# Patient Record
Sex: Male | Born: 1990 | Race: Black or African American | Marital: Single | Smoking: Never smoker
Health system: Southern US, Community
[De-identification: ages and names within clinical notes are randomized; demographics above are authoritative.]

## PROBLEM LIST (undated history)

## (undated) DIAGNOSIS — N289 Disorder of kidney and ureter, unspecified: Secondary | ICD-10-CM

## (undated) HISTORY — PX: OTHER SURGICAL HISTORY: SHX169

## (undated) HISTORY — PX: ARTHROSCOPIC REPAIR ACL: SUR80

---

## 2013-11-12 ENCOUNTER — Other Ambulatory Visit: Payer: Self-pay | Admitting: Orthopedic Surgery

## 2013-11-12 DIAGNOSIS — M25561 Pain in right knee: Secondary | ICD-10-CM

## 2013-11-12 DIAGNOSIS — M25461 Effusion, right knee: Secondary | ICD-10-CM

## 2013-11-20 ENCOUNTER — Ambulatory Visit
Admission: RE | Admit: 2013-11-20 | Discharge: 2013-11-20 | Disposition: A | Discharge status: Home / Self Care | Payer: BC Managed Care – PPO | Source: Ambulatory Visit | Attending: Orthopedic Surgery | Admitting: Orthopedic Surgery

## 2013-11-20 DIAGNOSIS — M25461 Effusion, right knee: Secondary | ICD-10-CM

## 2013-11-20 DIAGNOSIS — M25561 Pain in right knee: Secondary | ICD-10-CM

## 2018-10-30 ENCOUNTER — Emergency Department (HOSPITAL_COMMUNITY): Payer: Self-pay

## 2018-10-30 ENCOUNTER — Other Ambulatory Visit: Payer: Self-pay

## 2018-10-30 ENCOUNTER — Emergency Department (HOSPITAL_COMMUNITY)
Admission: EM | Admit: 2018-10-30 | Discharge: 2018-10-30 | Disposition: A | Discharge status: Home / Self Care | Payer: Self-pay | Attending: Emergency Medicine | Admitting: Emergency Medicine

## 2018-10-30 ENCOUNTER — Encounter (HOSPITAL_COMMUNITY): Payer: Self-pay | Admitting: Emergency Medicine

## 2018-10-30 DIAGNOSIS — Y929 Unspecified place or not applicable: Secondary | ICD-10-CM | POA: Insufficient documentation

## 2018-10-30 DIAGNOSIS — S41112A Laceration without foreign body of left upper arm, initial encounter: Secondary | ICD-10-CM | POA: Insufficient documentation

## 2018-10-30 DIAGNOSIS — Y999 Unspecified external cause status: Secondary | ICD-10-CM | POA: Insufficient documentation

## 2018-10-30 DIAGNOSIS — W268XXA Contact with other sharp object(s), not elsewhere classified, initial encounter: Secondary | ICD-10-CM | POA: Insufficient documentation

## 2018-10-30 DIAGNOSIS — Y9367 Activity, basketball: Secondary | ICD-10-CM | POA: Insufficient documentation

## 2018-10-30 DIAGNOSIS — S51812A Laceration without foreign body of left forearm, initial encounter: Secondary | ICD-10-CM

## 2018-10-30 MED ORDER — LIDOCAINE-EPINEPHRINE (PF) 2 %-1:200000 IJ SOLN
5.0000 mL | Freq: Once | INTRAMUSCULAR | Status: AC
  Administered 2018-10-30: 5 mL
  Filled 2018-10-30: qty 10

## 2018-10-30 NOTE — Discharge Instructions (Signed)
Please read instructions below.  Keep your wound clean and covered. In 24 hours, you can get your wound wet; gently clean it with soap and water, pat it dry, and reapply a clean bandage. You can take ibuprofen/advil as needed for pain Follow up with your primary care or urgent care for wound recheck in 7 days.  Return to the ER for fever, pus draining from wound, redness, or new or worsening symptoms.  

## 2018-10-30 NOTE — ED Triage Notes (Signed)
Patient reports lac to left arm after cutting it on a fence. Unknown last tetanus.

## 2018-10-30 NOTE — ED Provider Notes (Signed)
[place] DEPT Provider Note   CSN: [id number] Arrival date & time: 10/30/18  1728    History   Chief Complaint Chief Complaint  Patient presents with  . Laceration    HPI Ian Hancock is a 28 y.o. male without significant PMHx, presenting to the ED with complaint sudden onset of laceration to left forearm that occurred while playing basketball. He states he accidentally got pushed into a chain link fence which caused laceration. No interventions prior to arrival. Denies numbness. Last tetanus was under 5 years ago. No hx of immunocompromise.     The history is provided by the patient.    History reviewed. No pertinent past medical history.  There are no active problems to display for this patient.   History reviewed. No pertinent surgical history.      Home Medications    Prior to Admission medications   Not on File    Family History No family history on file.  Social History Social History   Tobacco Use  . Smoking status: Not on file  Substance Use Topics  . Alcohol use: Not on file  . Drug use: Not on file     Allergies   Patient has no known allergies.   Review of Systems Review of Systems  Skin: Positive for wound.  Allergic/Immunologic: Negative for immunocompromised state.  Neurological: Negative for numbness.     Physical Exam Updated Vital Signs BP (!) 141/84   Pulse 65   Temp 99 F (37.2 C) (Oral)   Resp 20   SpO2 99%   Physical Exam Vitals signs and nursing note reviewed.  Constitutional:      General: He is not in acute distress.    Appearance: He is well-developed.  HENT:     Head: Normocephalic and atraumatic.  Eyes:     Conjunctiva/sclera: Conjunctivae normal.  Cardiovascular:     Rate and Rhythm: Normal rate.  Pulmonary:     Effort: Pulmonary effort is normal.  Musculoskeletal:     Comments: Left distal posterior lateral forearm with 2 cm linear laceration through the dermis.   Base of wound visualized without any obvious injury to tendon or muscle.  There is no obvious retained foreign body.  Wound is not actively bleeding.  Patient has normal strength with grip as well as in extension of the wrist.  Brisk capillary refill and normal distal sensation.  Neurological:     Mental Status: He is alert.  Psychiatric:        Mood and Affect: Mood normal.        Behavior: Behavior normal.      ED Treatments / Results  Labs (all labs ordered are listed, but only abnormal results are displayed) Labs Reviewed - No data to display  EKG None  Radiology Dg Forearm Left  Result Date: 10/30/2018 CLINICAL DATA:  Laceration of the forearm secondary to a fall into a chain link fence. EXAM: LEFT FOREARM - 2 VIEW COMPARISON:  None. FINDINGS: There is no evidence of fracture or other focal bone lesions. No radiodense foreign body in the soft tissues. Dorsal mid forearm laceration. IMPRESSION: 1. No osseous abnormality. 2. No radiopaque foreign body. Electronically Signed   By: Lorriane Shire M.D.   On: 10/30/2018 18:25    Procedures .Marland KitchenLaceration Repair  Date/Time: 10/30/2018 8:57 PM Performed by: Annajulia Lewing, [place] N, PA-C Authorized by: Daionna Crossland, [place] N, PA-C   Consent:    Consent obtained:  Verbal   Consent given by:  Patient   Risks discussed:  Pain, poor cosmetic result and infection   Alternatives discussed:  No treatment Anesthesia (see MAR for exact dosages):    Anesthesia method:  Local infiltration   Local anesthetic:  Lidocaine 2% WITH epi Laceration details:    Location:  Shoulder/arm   Shoulder/arm location:  L lower arm   Length (cm):  2 Repair type:    Repair type:  Simple Pre-procedure details:    Preparation:  Patient was prepped and draped in usual sterile fashion and imaging obtained to evaluate for foreign bodies Exploration:    Hemostasis achieved with:  Direct pressure   Wound exploration: wound explored through full range of motion and entire  depth of wound probed and visualized     Wound extent: no foreign bodies/material noted, no muscle damage noted and no tendon damage noted     Contaminated: no   Treatment:    Area cleansed with:  Saline   Amount of cleaning:  Standard   Visualized foreign bodies/material removed: no   Skin repair:    Repair method:  Sutures   Suture size:  4-0   Wound skin closure material used: ethilon.   Suture technique:  Simple interrupted   Number of sutures:  3 Approximation:    Approximation:  Close Post-procedure details:    Dressing:  Non-adherent dressing   Patient tolerance of procedure:  Tolerated well, no immediate complications   (including critical care time)  Medications Ordered in ED Medications  lidocaine-EPINEPHrine (XYLOCAINE W/EPI) 2 %-1:200000 (PF) injection 5 mL (5 mLs Infiltration Given 10/30/18 2016)     Initial Impression / Assessment and Plan / ED Course  I have reviewed the triage vital signs and the nursing notes.  Pertinent labs & imaging results that were available during my care of the patient were reviewed by me and considered in my medical decision making (see chart for details).        Pt with laceration to left forearm that occurred while playing basketball today. Preserved active and resistive ROM. Wound explored and base of wound visualized in a bloodless field without evidence of foreign body. Laceration occurred < 8 hours prior to repair which was well tolerated.  Tdap up-to-date.  Pt has  no comorbidities to effect normal wound healing. Pt discharged without antibiotics.  Discussed suture home care with patient and answered questions. Pt to follow-up for wound check and suture removal in 7 days; they are to return to the ED sooner for signs of infection. Pt is hemodynamically stable with no complaints prior to dc.   Discussed results, findings, treatment and follow up. Patient advised of return precautions. Patient verbalized understanding and agreed with  plan.  Final Clinical Impressions(s) / ED Diagnoses   Final diagnoses:  Laceration of left forearm, initial encounter    ED Discharge Orders    None       Kevan Prouty, [place] N, PA-C 10/30/18 2104    Milagros Lollykstra, Richard S, MD 10/31/18 [phone]

## 2018-10-30 NOTE — ED Notes (Signed)
Irrigated wound with sterile NS pt tolerated well.

## 2018-11-10 ENCOUNTER — Emergency Department (HOSPITAL_COMMUNITY)
Admission: EM | Admit: 2018-11-10 | Discharge: 2018-11-10 | Disposition: A | Discharge status: Home / Self Care | Payer: Self-pay | Attending: Emergency Medicine | Admitting: Emergency Medicine

## 2018-11-10 ENCOUNTER — Other Ambulatory Visit: Payer: Self-pay

## 2018-11-10 ENCOUNTER — Encounter (HOSPITAL_COMMUNITY): Payer: Self-pay | Admitting: Family Medicine

## 2018-11-10 DIAGNOSIS — Z4802 Encounter for removal of sutures: Secondary | ICD-10-CM | POA: Insufficient documentation

## 2018-11-10 DIAGNOSIS — L03011 Cellulitis of right finger: Secondary | ICD-10-CM | POA: Insufficient documentation

## 2018-11-10 MED ORDER — CEPHALEXIN 500 MG PO CAPS: 500.0000 mg | ORAL_CAPSULE | Freq: Four times a day (QID) | ORAL | 0 refills | Status: DC

## 2018-11-10 MED ORDER — IBUPROFEN 200 MG PO TABS
600.0000 mg | ORAL_TABLET | Freq: Once | ORAL | Status: AC
  Administered 2018-11-10: 600 mg via ORAL
  Filled 2018-11-10: qty 3

## 2018-11-10 NOTE — Discharge Instructions (Signed)
Take Keflex until completed. Do warm soaks 2-3 times daily to help continue drainage. Please return in 2 days for wound recheck if you aren't seeing improvement or return if you develop any new or worsening symptoms including increasing redness, swelling, red streaking back your finger, or any other concerning symptoms. Make sure to protect your recently sutured wound cover or use sunscreen when outside. You can use Mederma on your wound to reduce scarring.

## 2018-11-10 NOTE — ED Triage Notes (Signed)
Patient has three stitiches that need to removed from his left arm. No signs of infection noted. Also, he has paronychia to his right index finger that he would like to be looked at.

## 2018-11-11 NOTE — ED Provider Notes (Signed)
[facility] Provider Note   CSN: [id number] Arrival date & time: 11/10/18  1552     History   Chief Complaint Chief Complaint  Patient presents with  . Suture / Staple Removal    HPI Ian Hancock is a 28 y.o. male who presents for left forearm suture removal that have been in place for 10/30/2018.  He also reports a 3-day history of swelling and pain to his right index finger around his nail after he was moving furniture and ran into a door with his finger.  His tetanus is up-to-date.  His sutured wound has been healing without a problem.  There is no drainage or pain.     HPI  History reviewed. No pertinent past medical history.  There are no active problems to display for this patient.   History reviewed. No pertinent surgical history.      Home Medications    Prior to Admission medications   Medication Sig Start Date End Date Taking? Authorizing Provider  cephALEXin (KEFLEX) 500 MG capsule Take 1 capsule (500 mg total) by mouth 4 (four) times daily. 11/10/18   Frederica Kuster, PA-C    Family History History reviewed. No pertinent family history.  Social History Social History   Tobacco Use  . Smoking status: Never Smoker  . Smokeless tobacco: Never Used  Substance Use Topics  . Alcohol use: Yes  . Drug use: Never     Allergies   Patient has no known allergies.   Review of Systems Review of Systems  Constitutional: Negative for fever.  Skin: Positive for wound.  Neurological: Negative for numbness.     Physical Exam Updated Vital Signs BP (!) 141/72 (BP Location: Left Arm)   Pulse (!) 58   Temp 99.2 F (37.3 C) (Oral)   Resp 18   SpO2 100%   Physical Exam Vitals signs and nursing note reviewed.  Constitutional:      General: He is not in acute distress.    Appearance: He is well-developed. He is not diaphoretic.  HENT:     Head: Normocephalic and atraumatic.     Mouth/Throat:     Pharynx: No  oropharyngeal exudate.  Eyes:     General: No scleral icterus.       Right eye: No discharge.        Left eye: No discharge.     Conjunctiva/sclera: Conjunctivae normal.     Pupils: Pupils are equal, round, and reactive to light.  Neck:     Musculoskeletal: Normal range of motion and neck supple.     Thyroid: No thyromegaly.  Cardiovascular:     Rate and Rhythm: Regular rhythm.     Heart sounds: Normal heart sounds. No murmur. No friction rub. No gallop.   Pulmonary:     Effort: Pulmonary effort is normal. No respiratory distress.     Breath sounds: Normal breath sounds. No stridor. No wheezing or rales.  Lymphadenopathy:     Cervical: No cervical adenopathy.  Skin:    General: Skin is warm and dry.     Coloration: Skin is not pale.     Findings: No rash.     Comments: Well-healed wound on the left forearm with 3 sutures in place, no erythema or drainage Paronychia to the right index finger with mild edema and associated erythema; tenderness; full range of motion of the digit  Neurological:     Mental Status: He is alert.     Coordination: Coordination  normal.      ED Treatments / Results  Labs (all labs ordered are listed, but only abnormal results are displayed) Labs Reviewed - No data to display  EKG None  Radiology No results found.  Procedures .Suture Removal  Date/Time: 11/11/2018 7:24 AM Performed by: Emi HolesLaw, Loana Salvaggio M, PA-C Authorized by: Emi HolesLaw, Fidela Cieslak M, PA-C   Consent:    Consent obtained:  Verbal   Consent given by:  Patient   Risks discussed:  Bleeding, pain and wound separation   Alternatives discussed:  No treatment Location:    Location:  Upper extremity   Upper extremity location:  Arm   Arm location:  L lower arm Procedure details:    Wound appearance:  No signs of infection, good wound healing and clean   Number of sutures removed:  3 Post-procedure details:    Post-removal:  No dressing applied   Patient tolerance of procedure:   Tolerated well, no immediate complications Drain paronychia  Date/Time: 11/11/2018 7:24 AM Performed by: Emi HolesLaw, Floree Zuniga M, PA-C Authorized by: Emi HolesLaw, Kameela Leipold M, PA-C  Consent: Verbal consent obtained. Consent given by: patient Patient identity confirmed: verbally with patient Local anesthesia used: no (offered, patient declined)  Anesthesia: Local anesthesia used: no (offered, patient declined)  Sedation: Patient sedated: no  Patient tolerance: patient tolerated the procedure well with no immediate complications Comments: Attempted 11 blade scalpel at the eponychial fold, however the abscess ended up draining with just light pressure of the pustule.  This was then irrigated copiously with saline.    (including critical care time)  Medications Ordered in ED Medications  ibuprofen (ADVIL) tablet 600 mg (600 mg Oral Given 11/10/18 1651)     Initial Impression / Assessment and Plan / ED Course  I have reviewed the triage vital signs and the nursing notes.  Pertinent labs & imaging results that were available during my care of the patient were reviewed by me and considered in my medical decision making (see chart for details).         Pt to ER for staple removal and wound check as above. Procedure tolerated well. Vitals normal, no signs of infection. Scar minimization & return precautions given.  Patient also with paronychia to right index finger.  This was drained as above.  Patient offered local anesthesia, however he declined.  Will cover with Keflex.  Return in 2 days for wound check if not improving.  Return sooner if he is developing increasing pain, redness, swelling, red streaking from the wound.  Patient understands and agrees with plan.  Patient vitals stable throughout ED course and discharged in satisfactory condition.   Final Clinical Impressions(s) / ED Diagnoses   Final diagnoses:  Visit for suture removal  Paronychia of finger of right hand    ED Discharge  Orders         Ordered    cephALEXin (KEFLEX) 500 MG capsule  4 times daily     11/10/18 [id number]1649           Saraiya Kozma, [place] Camp[place] M, PA-C 11/11/18 [id number]    Geoffery Lyonselo, [place], MD 11/11/18 1059

## 2019-01-02 ENCOUNTER — Emergency Department (HOSPITAL_COMMUNITY): Payer: Self-pay

## 2019-01-02 ENCOUNTER — Emergency Department (HOSPITAL_COMMUNITY)
Admission: EM | Admit: 2019-01-02 | Discharge: 2019-01-02 | Disposition: A | Discharge status: Home / Self Care | Payer: Self-pay | Attending: Emergency Medicine | Admitting: Emergency Medicine

## 2019-01-02 ENCOUNTER — Encounter (HOSPITAL_COMMUNITY): Payer: Self-pay

## 2019-01-02 ENCOUNTER — Other Ambulatory Visit: Payer: Self-pay

## 2019-01-02 DIAGNOSIS — Y280XXA Contact with sharp glass, undetermined intent, initial encounter: Secondary | ICD-10-CM | POA: Insufficient documentation

## 2019-01-02 DIAGNOSIS — S81812A Laceration without foreign body, left lower leg, initial encounter: Secondary | ICD-10-CM | POA: Insufficient documentation

## 2019-01-02 DIAGNOSIS — Y990 Civilian activity done for income or pay: Secondary | ICD-10-CM | POA: Insufficient documentation

## 2019-01-02 DIAGNOSIS — Y929 Unspecified place or not applicable: Secondary | ICD-10-CM | POA: Insufficient documentation

## 2019-01-02 DIAGNOSIS — Y939 Activity, unspecified: Secondary | ICD-10-CM | POA: Insufficient documentation

## 2019-01-02 DIAGNOSIS — Z23 Encounter for immunization: Secondary | ICD-10-CM | POA: Insufficient documentation

## 2019-01-02 HISTORY — DX: Disorder of kidney and ureter, unspecified: N28.9

## 2019-01-02 MED ORDER — LIDOCAINE-EPINEPHRINE 2 %-1:100000 IJ SOLN
20.0000 mL | Freq: Once | INTRAMUSCULAR | Status: AC
  Administered 2019-01-02: 11:00:00 20 mL
  Filled 2019-01-02: qty 1

## 2019-01-02 MED ORDER — TETANUS-DIPHTH-ACELL PERTUSSIS 5-2.5-18.5 LF-MCG/0.5 IM SUSP
0.5000 mL | Freq: Once | INTRAMUSCULAR | Status: AC
  Administered 2019-01-02: 0.5 mL via INTRAMUSCULAR
  Filled 2019-01-02: qty 0.5

## 2019-01-02 MED ORDER — CEPHALEXIN 500 MG PO CAPS: 500.0000 mg | ORAL_CAPSULE | Freq: Four times a day (QID) | ORAL | 0 refills | Status: AC

## 2019-01-02 MED ORDER — LIDOCAINE-EPINEPHRINE-TETRACAINE (LET) SOLUTION
3.0000 mL | Freq: Once | NASAL | Status: AC
  Administered 2019-01-02: 3 mL via TOPICAL
  Filled 2019-01-02: qty 3

## 2019-01-02 MED ORDER — CEPHALEXIN 500 MG PO CAPS
500.0000 mg | ORAL_CAPSULE | Freq: Once | ORAL | Status: AC
  Administered 2019-01-02: 13:00:00 500 mg via ORAL
  Filled 2019-01-02: qty 1

## 2019-01-02 NOTE — ED Triage Notes (Signed)
Patient states that he was taking the trash out at work and broken glass was in the bag, cutting his left lateral calf.

## 2019-01-02 NOTE — ED Provider Notes (Signed)
[facility] DEPT Provider Note   CSN: [id number] Arrival date & time: 01/02/19  0946     History   Chief Complaint Chief Complaint  Patient presents with  . calf laceration    HPI Ian Hancock is a 28 y.o. male presents today for 2 lacerations of the left lower leg that occurred just prior to arrival.  Patient reports he was taking out the trash at work when the trash bag struck him in the leg, he noticed a sharp pain and looked down noticed blood covering his leg and shoe.  He describes pain at this time as a mild aching sensation constant worsened with palpation and improved with rest, no radiation of pain.  He denies any other injuries or concerns today.  Denies fever/chills, headache/head injury, neck pain, chest pain/shortness breath, abdominal pain, nausea/vomiting, numbness/tingling, weakness or any additional concerns.  Of note patient's "renal disorder" is a history of kidney stones.  He denies history of CKD or any residual effects.  He reports that he is otherwise healthy.     HPI  Past Medical History:  Diagnosis Date  . Renal disorder     There are no active problems to display for this patient.   Past Surgical History:  Procedure Laterality Date  . ARTHROSCOPIC REPAIR ACL    . kidney stone removal          Home Medications    Prior to Admission medications   Medication Sig Start Date End Date Taking? Authorizing Provider  cephALEXin (KEFLEX) 500 MG capsule Take 1 capsule (500 mg total) by mouth 4 (four) times daily for 7 days. 01/02/19 01/09/19  Deliah Boston, PA-C    Family History Family History  Problem Relation Age of Onset  . Healthy Mother   . Healthy Father     Social History Social History   Tobacco Use  . Smoking status: Never Smoker  . Smokeless tobacco: Never Used  Substance Use Topics  . Alcohol use: Yes  . Drug use: Never     Allergies   Patient has no known allergies.   Review of  Systems Review of Systems Ten systems are reviewed and are negative for acute change except as noted in the HPI  Physical Exam Updated Vital Signs BP 132/74 (BP Location: Right Arm)   Pulse 68   Temp 98.5 F (36.9 C) (Oral)   Resp 16   Ht 5\' 7"  (1.702 m)   Wt 77.1 kg   SpO2 99%   BMI 26.63 kg/m   Physical Exam Constitutional:      General: He is not in acute distress.    Appearance: Normal appearance. He is well-developed. He is not ill-appearing or diaphoretic.  HENT:     Head: Normocephalic and atraumatic.     Right Ear: External ear normal.     Left Ear: External ear normal.     Nose: Nose normal.  Eyes:     General: Vision grossly intact. Gaze aligned appropriately.     Pupils: Pupils are equal, round, and reactive to light.  Neck:     Musculoskeletal: Normal range of motion.     Trachea: Trachea and phonation normal. No tracheal deviation.  Cardiovascular:     Pulses:          Dorsalis pedis pulses are 2+ on the right side and 2+ on the left side.  Pulmonary:     Effort: Pulmonary effort is normal. No respiratory distress.  Abdominal:  General: There is no distension.     Palpations: Abdomen is soft.     Tenderness: There is no abdominal tenderness. There is no guarding or rebound.  Musculoskeletal: Normal range of motion.  Feet:     Right foot:     Protective Sensation: 3 sites tested. 3 sites sensed.     Left foot:     Protective Sensation: 3 sites tested. 3 sites sensed.  Skin:    General: Skin is warm and dry.     Capillary Refill: Capillary refill takes less than 2 seconds.          Comments: 2 linear parallel lacerations of the left lateral lower leg.  Superior laceration is gaping, approximately 7.5 cm in length with adipose tissue present.  Second laceration just inferior is approximately 5 cm in length, more superficial than above.  Neurological:     Mental Status: He is alert.     GCS: GCS eye subscore is 4. GCS verbal subscore is 5. GCS motor  subscore is 6.     Comments: Speech is clear and goal oriented, follows commands Major Cranial nerves without deficit, no facial droop Moves extremities without ataxia, coordination intact  Psychiatric:        Behavior: Behavior normal.      ED Treatments / Results  Labs (all labs ordered are listed, but only abnormal results are displayed) Labs Reviewed - No data to display  EKG None  Radiology Dg Tibia/fibula Left  Result Date: 01/02/2019 CLINICAL DATA:  Laceration assess for foreign body. EXAM: LEFT TIBIA AND FIBULA - 2 VIEW COMPARISON:  None. FINDINGS: There is no evidence of fracture or dislocation. There is a 5 mm density in the skin/soft tissues of the lateral lower leg. IMPRESSION: No acute fracture or dislocation identified. There is a 5 mm density in the skin/soft tissues of the lateral lower leg. Electronically Signed   By: Sherian Rein M.D.   On: 01/02/2019 10:49    Procedures .Marland KitchenLaceration Repair  Date/Time: 01/02/2019 12:33 PM Performed by: Bill Salinas, PA-C Authorized by: Bill Salinas, PA-C   Consent:    Consent obtained:  Verbal   Consent given by:  Patient   Risks discussed:  Pain, infection, need for additional repair, nerve damage, poor wound healing, poor cosmetic result, retained foreign body and tendon damage Anesthesia (see MAR for exact dosages):    Anesthesia method:  Topical application and local infiltration   Topical anesthetic:  LET   Local anesthetic:  Lidocaine 1% WITH epi Laceration details:    Location:  Leg   Leg location:  L lower leg   Length (cm):  7.5   Depth (mm):  7 Repair type:    Repair type:  Simple Pre-procedure details:    Preparation:  Patient was prepped and draped in usual sterile fashion and imaging obtained to evaluate for foreign bodies Exploration:    Hemostasis achieved with:  LET and epinephrine   Wound extent: foreign bodies/material     Wound extent: no muscle damage noted, no nerve damage noted, no  tendon damage noted, no underlying fracture noted and no vascular damage noted     Foreign bodies/material:  Dirt Treatment:    Area cleansed with:  Saline, Shur-Clens and Betadine   Amount of cleaning:  Standard   Irrigation solution:  Sterile saline   Irrigation method:  Pressure wash Skin repair:    Repair method:  Sutures   Suture size:  4-0   Wound skin  closure material used: Ethilon.   Suture technique:  Simple interrupted   Number of sutures:  9 Approximation:    Approximation:  Close Post-procedure details:    Dressing:  Non-adherent dressing, sterile dressing and antibiotic ointment   Patient tolerance of procedure:  Tolerated well, no immediate complications Comments:     Dressing to be applied by nursing staff. ..Laceration Repair  Date/Time: 10/Marland Kitchen9/2020 12:34 PM Performed by: Bill SalinasMorelli, Sharlet Notaro A, PA-C Authorized by: Bill SalinasMorelli, Rondrick Barreira A, PA-C   Consent:    Consent obtained:  Verbal   Consent given by:  Patient   Risks discussed:  Infection, need for additional repair, nerve damage, poor wound healing, poor cosmetic result, pain, tendon damage, retained foreign body and vascular damage Anesthesia (see MAR for exact dosages):    Anesthesia method:  Topical application and local infiltration   Topical anesthetic:  LET   Local anesthetic:  Lidocaine 1% WITH epi Laceration details:    Location:  Leg   Leg location:  L lower leg   Length (cm):  5   Depth (mm):  4 Repair type:    Repair type:  Simple Pre-procedure details:    Preparation:  Patient was prepped and draped in usual sterile fashion and imaging obtained to evaluate for foreign bodies Exploration:    Hemostasis achieved with:  Epinephrine and LET   Wound exploration: wound explored through full range of motion and entire depth of wound probed and visualized     Wound extent: foreign bodies/material     Wound extent: no muscle damage noted, no nerve damage noted, no tendon damage noted, no underlying fracture  noted and no vascular damage noted     Foreign bodies/material:  Dirt Treatment:    Area cleansed with:  Saline, Shur-Clens and Betadine   Amount of cleaning:  Standard   Irrigation solution:  Sterile saline   Irrigation method:  Pressure wash   Visualized foreign bodies/material removed: yes   Skin repair:    Repair method:  Sutures   Suture size:  4-0   Wound skin closure material used: ethilon.   Suture technique:  Simple interrupted   Number of sutures:  6 Approximation:    Approximation:  Close Post-procedure details:    Dressing:  Non-adherent dressing, sterile dressing and antibiotic ointment   Patient tolerance of procedure:  Tolerated well, no immediate complications Comments:     Dressing to be applied by nursing staff   (including critical care time)  Medications Ordered in ED Medications  Tdap (BOOSTRIX) injection 0.5 mL (0.5 mLs Intramuscular Given 01/02/19 1111)  lidocaine-EPINEPHrine-tetracaine (LET) solution (3 mLs Topical Given 01/02/19 1112)  lidocaine-EPINEPHrine (XYLOCAINE W/EPI) 2 %-1:100000 (with pres) injection 20 mL (20 mLs Infiltration Given by Other 01/02/19 1112)  cephALEXin (KEFLEX) capsule 500 mg (500 mg Oral Given 01/02/19 1247)     Initial Impression / Assessment and Plan / ED Course  I have reviewed the triage vital signs and the nursing notes.  Pertinent labs & imaging results that were available during my care of the patient were reviewed by me and considered in my medical decision making (see chart for details).    Ian Hancock is a 28 y.o. male who presents to ED for 2 lacerations of the left lower leg.  DG Tib/Fib: IMPRESSION: No acute fracture or dislocation identified. There is a 5 mm density in the skin/soft tissues of the lateral lower leg. - Patient reports unknown last Tdap and believes greater than 10 years ago, Tdap updated today.  Wound thoroughly cleaned in ED today. Wound explored and bottom of wound seen in a bloodless  field. Laceration repaired as dictated above.  Dirt and debris removed from wound.  Based on wound mechanism and contamination will prescribe Keflex for infection prophylaxis.   Patient counseled on home wound care. Follow up with PCP/urgent care or return to ER for suture removal in 10 days. Patient was urged to return to the Emergency Department for worsening pain, swelling, expanding erythema especially if it streaks away from the affected area, fever, or for any additional concerns.  At this time there does not appear to be any evidence of an acute emergency medical condition and the patient appears stable for discharge with appropriate outpatient follow up. Diagnosis was discussed with patient who verbalizes understanding of care plan and is agreeable to discharge. I have discussed return precautions with patient who verbalizes understanding of return precautions. Patient encouraged to follow-up with their PCP. All questions answered.  Patient's case discussed with Dr. Rhunette Croft who agrees with plan to discharge with Keflex and outpatient follow-up.   Note: Portions of this report may have been transcribed using voice recognition software. Every effort was made to ensure accuracy; however, inadvertent computerized transcription errors may still be present. Final Clinical Impressions(s) / ED Diagnoses   Final diagnoses:  Laceration of multiple sites of left lower extremity, initial encounter    ED Discharge Orders         Ordered    cephALEXin (KEFLEX) 500 MG capsule  4 times daily     01/02/19 1242           Elizabeth Palau 01/02/19 1249    Derwood Kaplan, MD 01/02/19 [phone]

## 2019-01-02 NOTE — Discharge Instructions (Signed)
You have been diagnosed today with 2 lacerations of the left lower leg.  At this time there does not appear to be the presence of an emergent medical condition, however there is always the potential for conditions to change. Please read and follow the below instructions.  Please return to the Emergency Department immediately for any new or worsening symptoms. Please be sure to follow up with your Primary Care Provider within one week regarding your visit today; please call their office to schedule an appointment even if you are feeling better for a follow-up visit. Please take antibiotic Keflex as prescribed to avoid infection of your leg.  Your stitches must be removed in 10 days you may have them taken out at an urgent care, here at the emergency department or by your primary care provider.  Return to the emergency department immediately if you notice signs of infection.  Get help right away if: You have very bad swelling around your wound. You have pus or a bad smell coming from your wound. Your pain suddenly gets worse and is very bad. You have fever or chills. You have painful lumps near your wound or anywhere on your body. You have a red streak going away from your wound. The wound is on your hand or foot, and: You cannot move a finger or toe as you used to do. Your fingers or toes look pale or blue. You have numbness that spreads down your hand, foot, fingers, or toes. You have any new/concerning or worsening of symptoms  Please read the additional information packets attached to your discharge summary.  Do not take your medicine if  develop an itchy rash, swelling in your mouth or lips, or difficulty breathing; call 911 and seek immediate emergency medical attention if this occurs.  Note: Portions of this text may have been transcribed using voice recognition software. Every effort was made to ensure accuracy; however, inadvertent computerized transcription errors may still be  present.

## 2019-01-13 ENCOUNTER — Emergency Department (HOSPITAL_COMMUNITY)
Admission: EM | Admit: 2019-01-13 | Discharge: 2019-01-13 | Disposition: A | Discharge status: Home / Self Care | Payer: Self-pay | Attending: Emergency Medicine | Admitting: Emergency Medicine

## 2019-01-13 ENCOUNTER — Other Ambulatory Visit: Payer: Self-pay

## 2019-01-13 ENCOUNTER — Encounter (HOSPITAL_COMMUNITY): Payer: Self-pay | Admitting: *Deleted

## 2019-01-13 DIAGNOSIS — Z4802 Encounter for removal of sutures: Secondary | ICD-10-CM | POA: Insufficient documentation

## 2019-01-13 MED ORDER — BACITRACIN-NEOMYCIN-POLYMYXIN 400-5-5000 EX OINT
TOPICAL_OINTMENT | Freq: Once | CUTANEOUS | Status: AC
  Administered 2019-01-13: 1 via TOPICAL
  Filled 2019-01-13: qty 1

## 2019-01-13 NOTE — ED Triage Notes (Signed)
Returned for suture removal from left lower leg

## 2019-01-13 NOTE — ED Provider Notes (Signed)
[facility]-EMERGENCY DEPT Provider Note   CSN: [id number] Arrival date & time: 01/13/19  1723     History   Chief Complaint Chief Complaint  Patient presents with  . Suture / Staple Removal    HPI Ian Hancock is a 28 y.o. male.     HPI 28 year old male presents the ER for suture removal.  Patient had stitches placed approximate 2 weeks ago after laceration to his left calf.  Has been doing well.  No drainage.  He denies any redness or fevers.  Mild irritation when he stands. Past Medical History:  Diagnosis Date  . Renal disorder     There are no active problems to display for this patient.   Past Surgical History:  Procedure Laterality Date  . ARTHROSCOPIC REPAIR ACL    . kidney stone removal          Home Medications    Prior to Admission medications   Not on File    Family History Family History  Problem Relation Age of Onset  . Healthy Mother   . Healthy Father     Social History Social History   Tobacco Use  . Smoking status: Never Smoker  . Smokeless tobacco: Never Used  Substance Use Topics  . Alcohol use: Yes  . Drug use: Never     Allergies   Patient has no known allergies.   Review of Systems Review of Systems  Constitutional: Negative for chills and fever.  Eyes: Negative for discharge.  Respiratory: Negative for cough.   Musculoskeletal: Negative for myalgias.  Skin: Positive for wound.  Neurological: Negative for weakness and numbness.  Psychiatric/Behavioral: Negative for confusion.     Physical Exam Updated Vital Signs BP 118/65 (BP Location: Left Arm)   Pulse 70   Temp 99.2 F (37.3 C) (Oral)   Resp 16   Ht 5\' 11"  (1.803 m)   Wt 77.1 kg   SpO2 99%   BMI 23.71 kg/m   Physical Exam Vitals signs and nursing note reviewed.  Constitutional:      General: He is not in acute distress.    Appearance: He is well-developed.  HENT:     Head: Normocephalic and atraumatic.  Eyes:   General: No scleral icterus.       Right eye: No discharge.        Left eye: No discharge.  Neck:     Musculoskeletal: Normal range of motion.  Pulmonary:     Effort: No respiratory distress.  Musculoskeletal: Normal range of motion.  Skin:    General: Skin is warm and dry.     Capillary Refill: Capillary refill takes less than 2 seconds.     Coloration: Skin is not pale.     Comments: 2 healing lacerations to the left lateral calf.  There is no drainage, fluctuance.  No dehiscence of the wound.  Minimal erythema noted.  Nontender to palpation.  Neurological:     Mental Status: He is alert.  Psychiatric:        Behavior: Behavior normal.        Thought Content: Thought content normal.        Judgment: Judgment normal.      ED Treatments / Results  Labs (all labs ordered are listed, but only abnormal results are displayed) Labs Reviewed - No data to display  EKG None  Radiology No results found.  Procedures .Suture Removal  Date/Time: 01/13/2019 9:12 PM Performed by: 01/15/2019, PA-C Authorized  by: Doristine Devoid, PA-C   Consent:    Consent obtained:  Verbal   Consent given by:  Patient   Risks discussed:  Bleeding, pain and wound separation   Alternatives discussed:  No treatment Location:    Location:  Lower extremity   Lower extremity location:  Leg   Leg location:  L lower leg Procedure details:    Wound appearance:  No signs of infection, good wound healing and clean   Number of sutures removed:  15 Post-procedure details:    Post-removal:  Antibiotic ointment applied and dressing applied   Patient tolerance of procedure:  Tolerated well, no immediate complications   (including critical care time)  Medications Ordered in ED Medications  neomycin-bacitracin-polymyxin (NEOSPORIN) ointment packet (1 application Topical Given 01/13/19 1942)     Initial Impression / Assessment and Plan / ED Course  I have reviewed the triage vital signs  and the nursing notes.  Pertinent labs & imaging results that were available during my care of the patient were reviewed by me and considered in my medical decision making (see chart for details).        Staple removal   Pt to ER for staple/suture removal and wound check as above. Procedure tolerated well. Vitals normal, no signs of infection. Scar minimization & return precautions given at dc.    Final Clinical Impressions(s) / ED Diagnoses   Final diagnoses:  Visit for suture removal    ED Discharge Orders    None       Aaron Edelman 01/13/19 2112    Julianne Rice, MD 01/13/19 2228

## 2020-03-13 IMAGING — CR DG TIBIA/FIBULA 2V*L*
2 series · 2 of 2 positions shown · non-contrast
Comparison: None.

CLINICAL DATA: Laceration assess for foreign body.

EXAM:
LEFT TIBIA AND FIBULA - 2 VIEW

[x tib-fib ap left]
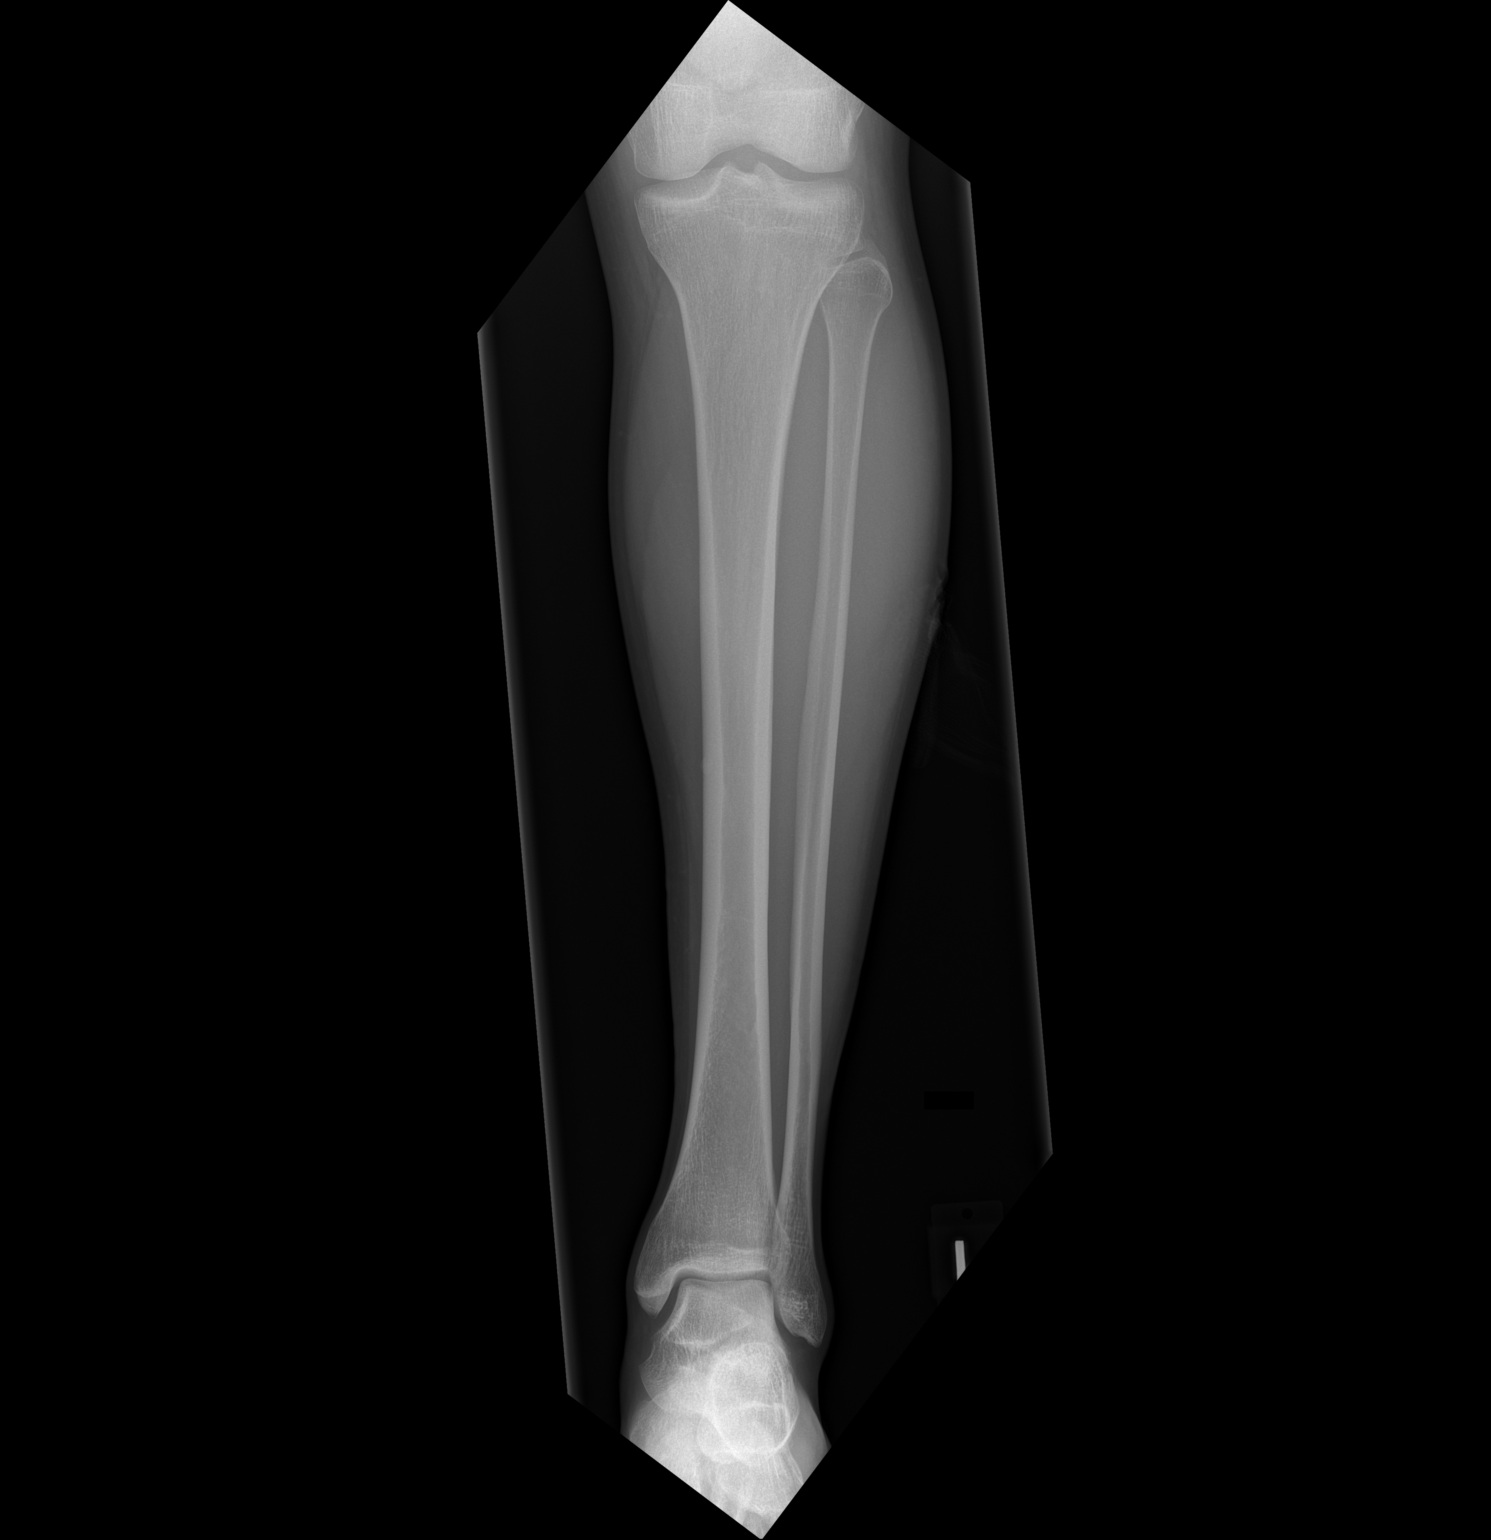

[x tib-fib lat left]
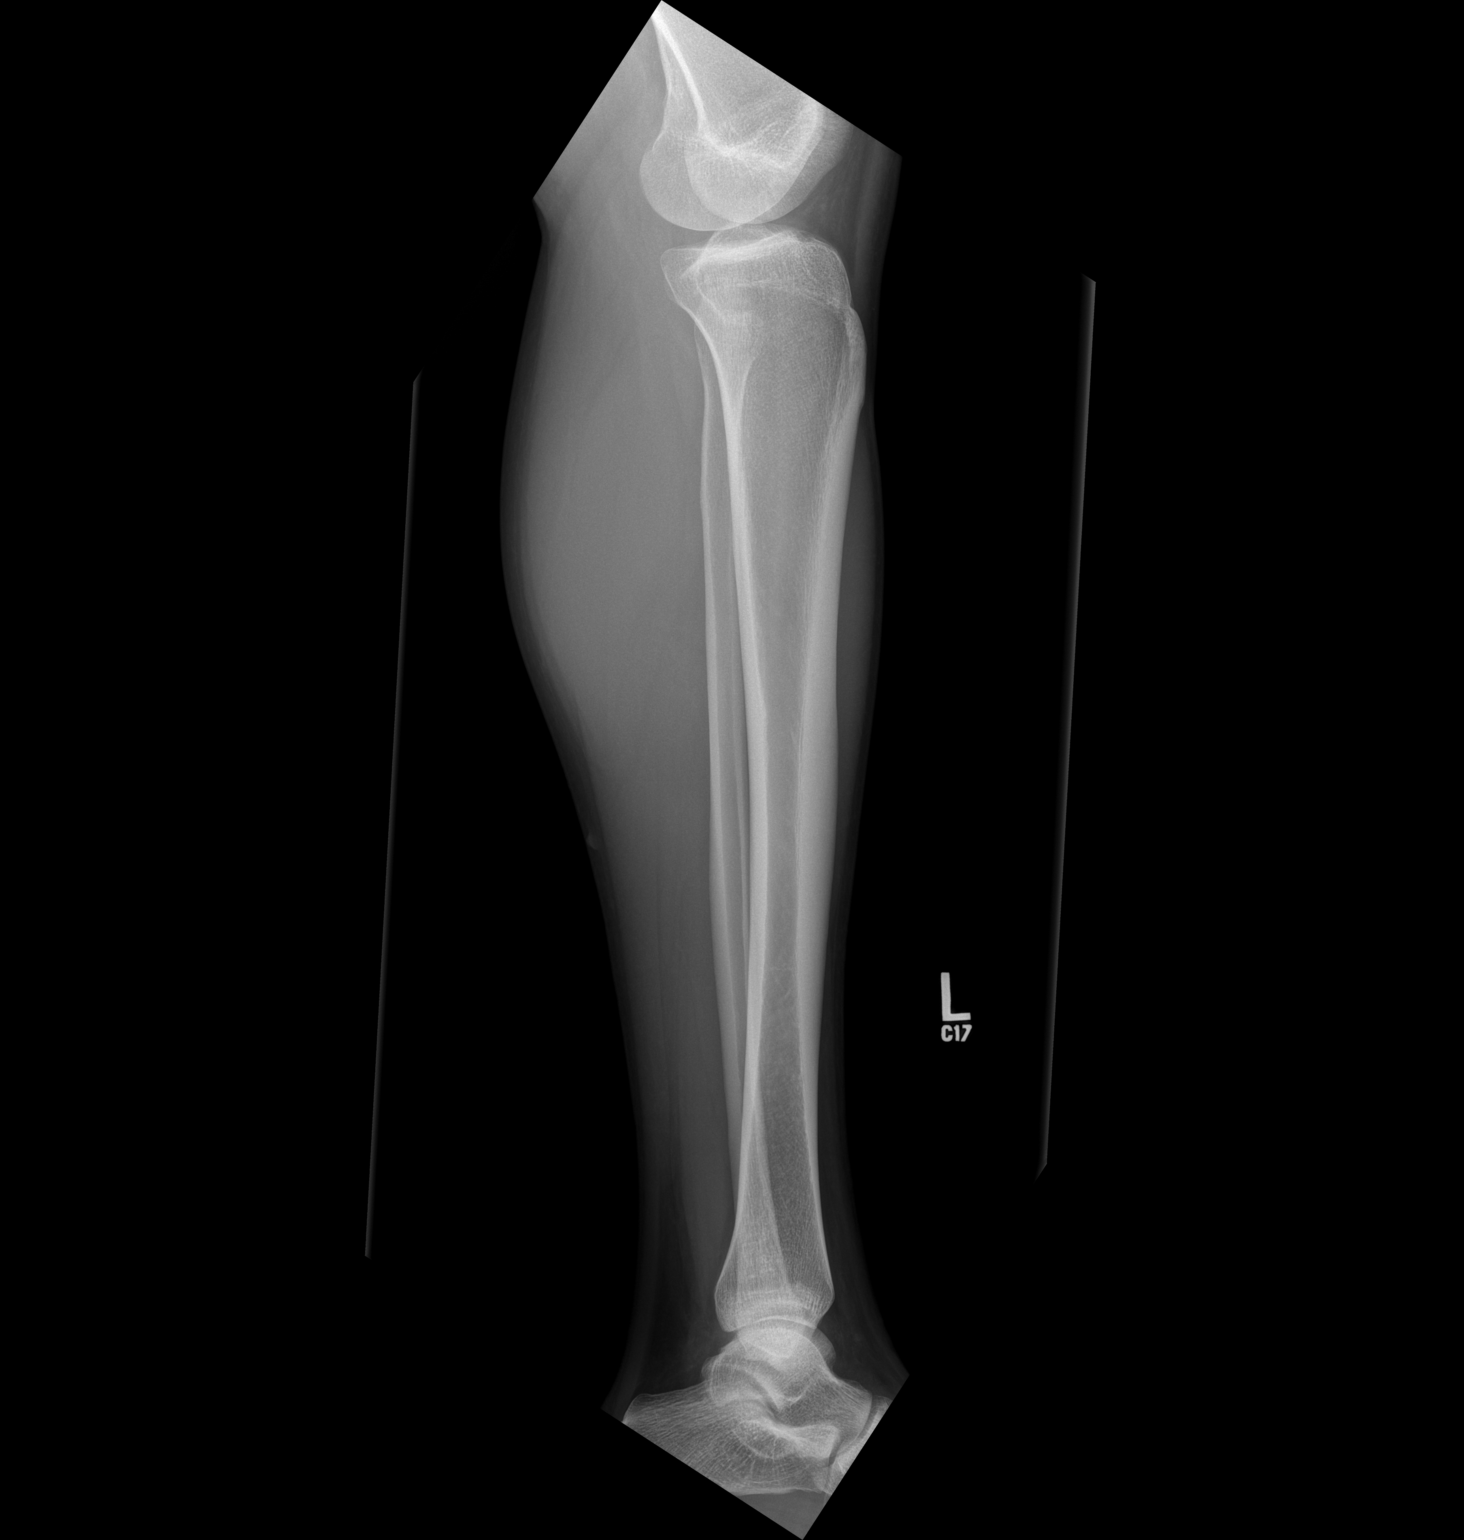

[2 of 2 positions shown; findings below may reference images not displayed]

FINDINGS: There is no evidence of fracture or dislocation. There is a 5 mm
density in the skin/soft tissues of the lateral lower leg.
IMPRESSION: No acute fracture or dislocation identified.

There is a 5 mm density in the skin/soft tissues of the lateral
lower leg.

## 2021-03-16 ENCOUNTER — Encounter (HOSPITAL_COMMUNITY): Payer: Self-pay

## 2021-03-16 ENCOUNTER — Other Ambulatory Visit: Payer: Self-pay

## 2021-03-16 ENCOUNTER — Emergency Department (HOSPITAL_COMMUNITY)
Admission: EM | Admit: 2021-03-16 | Discharge: 2021-03-17 | Disposition: A | Discharge status: Home / Self Care | Payer: Self-pay | Attending: Emergency Medicine | Admitting: Emergency Medicine

## 2021-03-16 DIAGNOSIS — R519 Headache, unspecified: Secondary | ICD-10-CM | POA: Insufficient documentation

## 2021-03-16 DIAGNOSIS — R0602 Shortness of breath: Secondary | ICD-10-CM | POA: Insufficient documentation

## 2021-03-16 DIAGNOSIS — Z5321 Procedure and treatment not carried out due to patient leaving prior to being seen by health care provider: Secondary | ICD-10-CM | POA: Insufficient documentation

## 2021-03-16 LAB — CBC WITH DIFFERENTIAL/PLATELET
Abs Immature Granulocytes: 0.03 10*3/uL (ref 0.00–0.07)
Basophils Absolute: 0.1 10*3/uL (ref 0.0–0.1)
Basophils Relative: 1 %
Eosinophils Absolute: 0.8 10*3/uL — ABNORMAL HIGH (ref 0.0–0.5)
Eosinophils Relative: 7 %
HCT: 45.6 % (ref 39.0–52.0)
Hemoglobin: 15.6 g/dL (ref 13.0–17.0)
Immature Granulocytes: 0 %
Lymphocytes Relative: 29 %
Lymphs Abs: 3.7 10*3/uL (ref 0.7–4.0)
MCH: 32.4 pg (ref 26.0–34.0)
MCHC: 34.2 g/dL (ref 30.0–36.0)
MCV: 94.8 fL (ref 80.0–100.0)
Monocytes Absolute: 1 10*3/uL (ref 0.1–1.0)
Monocytes Relative: 8 %
Neutro Abs: 7.1 10*3/uL (ref 1.7–7.7)
Neutrophils Relative %: 55 %
Platelets: 217 10*3/uL (ref 150–400)
RBC: 4.81 MIL/uL (ref 4.22–5.81)
RDW: 12.6 % (ref 11.5–15.5)
WBC: 12.7 10*3/uL — ABNORMAL HIGH (ref 4.0–10.5)
nRBC: 0 % (ref 0.0–0.2)

## 2021-03-16 LAB — COMPREHENSIVE METABOLIC PANEL
ALT: 26 U/L (ref 0–44)
AST: 25 U/L (ref 15–41)
Albumin: 4.6 g/dL (ref 3.5–5.0)
Alkaline Phosphatase: 45 U/L (ref 38–126)
Anion gap: 8 (ref 5–15)
BUN: 13 mg/dL (ref 6–20)
CO2: 24 mmol/L (ref 22–32)
Calcium: 8.9 mg/dL (ref 8.9–10.3)
Chloride: 104 mmol/L (ref 98–111)
Creatinine, Ser: 1.12 mg/dL (ref 0.61–1.24)
GFR, Estimated: >60 mL/min (ref >60)
Glucose, Bld: 140 mg/dL — ABNORMAL HIGH (ref 70–99)
Potassium: 3.7 mmol/L (ref 3.5–5.1)
Sodium: 136 mmol/L (ref 135–145)
Total Bilirubin: 0.5 mg/dL (ref 0.3–1.2)
Total Protein: 7.5 g/dL (ref 6.5–8.1)

## 2021-03-16 NOTE — ED Triage Notes (Addendum)
Pt c/o severe headache starting this morning. Pt states pain is "pounding on his head". Pt denies chest pain. Pt states he has SOB that comes and goes. Pt states he has some SOB at times with exertion and when resting x5 months. Pt BP 160/97 in triage.

## 2021-04-07 DIAGNOSIS — Z720 Tobacco use: Secondary | ICD-10-CM | POA: Diagnosis not present

## 2021-04-07 DIAGNOSIS — R0989 Other specified symptoms and signs involving the circulatory and respiratory systems: Secondary | ICD-10-CM | POA: Diagnosis not present

## 2021-04-07 DIAGNOSIS — R519 Headache, unspecified: Secondary | ICD-10-CM | POA: Diagnosis not present

## 2021-04-07 DIAGNOSIS — E669 Obesity, unspecified: Secondary | ICD-10-CM | POA: Diagnosis not present

## 2021-07-10 DIAGNOSIS — Z13220 Encounter for screening for lipoid disorders: Secondary | ICD-10-CM | POA: Diagnosis not present

## 2021-07-10 DIAGNOSIS — E6609 Other obesity due to excess calories: Secondary | ICD-10-CM | POA: Diagnosis not present

## 2021-07-10 DIAGNOSIS — R030 Elevated blood-pressure reading, without diagnosis of hypertension: Secondary | ICD-10-CM | POA: Diagnosis not present

## 2021-07-10 DIAGNOSIS — M533 Sacrococcygeal disorders, not elsewhere classified: Secondary | ICD-10-CM | POA: Diagnosis not present

## 2021-07-10 DIAGNOSIS — Z0000 Encounter for general adult medical examination without abnormal findings: Secondary | ICD-10-CM | POA: Diagnosis not present

## 2021-07-10 DIAGNOSIS — Z6832 Body mass index (BMI) 32.0-32.9, adult: Secondary | ICD-10-CM | POA: Diagnosis not present

## 2021-11-23 DIAGNOSIS — J020 Streptococcal pharyngitis: Secondary | ICD-10-CM | POA: Diagnosis not present

## 2023-01-22 ENCOUNTER — Other Ambulatory Visit: Payer: Self-pay

## 2023-01-22 ENCOUNTER — Encounter (HOSPITAL_BASED_OUTPATIENT_CLINIC_OR_DEPARTMENT_OTHER): Payer: Self-pay

## 2023-01-22 DIAGNOSIS — L0231 Cutaneous abscess of buttock: Secondary | ICD-10-CM | POA: Insufficient documentation

## 2023-01-22 NOTE — ED Triage Notes (Signed)
Pt reports a cyst on tailbone since last week. Denies fevers. Has had this before a long time ago. Taking OTC meds with no relief of pain.

## 2023-01-23 ENCOUNTER — Emergency Department (HOSPITAL_BASED_OUTPATIENT_CLINIC_OR_DEPARTMENT_OTHER)
Admission: EM | Admit: 2023-01-23 | Discharge: 2023-01-23 | Disposition: A | Discharge status: Home / Self Care | Payer: Self-pay | Attending: Emergency Medicine | Admitting: Emergency Medicine

## 2023-01-23 DIAGNOSIS — L0231 Cutaneous abscess of buttock: Secondary | ICD-10-CM

## 2023-01-23 MED ORDER — LIDOCAINE HCL (PF) 1 % IJ SOLN
5.0000 mL | Freq: Once | INTRAMUSCULAR | Status: AC
  Administered 2023-01-23: 5 mL via INTRADERMAL
  Filled 2023-01-23: qty 5

## 2023-01-23 NOTE — ED Provider Notes (Signed)
   EMERGENCY DEPARTMENT AT [facility] Provider Note   CSN: [id number] Arrival date & time: 01/22/23  2325     History  Chief Complaint  Patient presents with   Cyst    Ian Hancock is a 32 y.o. male.  Patient is a 32 year old male presenting with complaints of pain and swelling to the inside of his left gluteal cleft.  This has been worsening over the past week.  He was started on doxycycline at urgent care last week, but was told it was too small to drain.  No fevers or chills.  The history is provided by the patient.       Home Medications Prior to Admission medications   Not on File      Allergies    Patient has no known allergies.    Review of Systems   Review of Systems  All other systems reviewed and are negative.   Physical Exam Updated Vital Signs BP (!) 162/92 (BP Location: Right Arm)   Pulse (!) 102   Temp 98 F (36.7 C)   Resp 18   Ht 5\' 7"  (1.702 m)   Wt 95.3 kg   SpO2 99%   BMI 32.89 kg/m  Physical Exam Vitals and nursing note reviewed.  Constitutional:      Appearance: Normal appearance.  Pulmonary:     Effort: Pulmonary effort is normal.  Skin:    General: Skin is warm and dry.     Comments: To the medial aspect of the left buttock within the gluteal cleft is a swollen, tender, erythematous, fluctuant area measuring approximately 3 cm x 5 cm.  Neurological:     Mental Status: He is alert and oriented to person, place, and time.     ED Results / Procedures / Treatments   Labs (all labs ordered are listed, but only abnormal results are displayed) Labs Reviewed - No data to display  EKG None  Radiology No results found.  Procedures Procedures  INCISION AND DRAINAGE Performed by: Geoffery Lyons Consent: Verbal consent obtained. Risks and benefits: risks, benefits and alternatives were discussed Type: abscess  Body area: left buttock  Anesthesia: local infiltration  Incision was made with a  scalpel.  Local anesthetic: lidocaine 1% without epinephrine  Anesthetic total: 4 ml  Complexity: complex Blunt dissection to break up loculations  Drainage: purulent  Drainage amount: moderate  Packing material: No packing placed  Patient tolerance: Patient tolerated the procedure well with no immediate complications.    Medications Ordered in ED Medications  lidocaine (PF) (XYLOCAINE) 1 % injection 5 mL (5 mLs Intradermal Given by Other 01/23/23 0143)    ED Course/ Medical Decision Making/ A&P  Patient presenting with an abscess within his left gluteal cleft.  This was incised and drained as above.  Patient to continue with doxycycline, warm sitz bath, and follow-up as needed if symptoms worsen.  Final Clinical Impression(s) / ED Diagnoses Final diagnoses:  None    Rx / DC Orders ED Discharge Orders     None         Geoffery Lyons, MD 01/23/23 [phone]

## 2023-01-23 NOTE — Discharge Instructions (Signed)
Continue doxycycline as previously prescribed.  Perform sitz bath's or apply warm compresses as frequently as possible for the next several days.  Return to the ER if symptoms worsen or change.
